# Patient Record
Sex: Female | Born: 1975 | Race: White | Hispanic: No | Marital: Married | Smoking: Never smoker
Health system: Southern US, Community
[De-identification: ages and names within clinical notes are randomized; demographics above are authoritative.]

## PROBLEM LIST (undated history)

## (undated) DIAGNOSIS — E282 Polycystic ovarian syndrome: Secondary | ICD-10-CM

## (undated) DIAGNOSIS — L089 Local infection of the skin and subcutaneous tissue, unspecified: Secondary | ICD-10-CM

## (undated) HISTORY — PX: WISDOM TOOTH EXTRACTION: SHX21

---

## 2000-04-20 HISTORY — PX: COLONOSCOPY: SHX174

## 2001-01-26 ENCOUNTER — Ambulatory Visit (HOSPITAL_COMMUNITY): Admission: RE | Admit: 2001-01-26 | Discharge: 2001-01-26 | Payer: Self-pay | Admitting: *Deleted

## 2001-01-26 ENCOUNTER — Encounter (INDEPENDENT_AMBULATORY_CARE_PROVIDER_SITE_OTHER): Payer: Self-pay | Admitting: *Deleted

## 2002-01-16 ENCOUNTER — Other Ambulatory Visit: Admission: RE | Admit: 2002-01-16 | Discharge: 2002-01-16 | Payer: Self-pay | Admitting: Obstetrics & Gynecology

## 2003-02-13 ENCOUNTER — Other Ambulatory Visit: Admission: RE | Admit: 2003-02-13 | Discharge: 2003-02-13 | Payer: Self-pay | Admitting: Obstetrics and Gynecology

## 2004-07-23 ENCOUNTER — Ambulatory Visit: Payer: Self-pay | Admitting: Internal Medicine

## 2004-07-30 ENCOUNTER — Ambulatory Visit: Payer: Self-pay | Admitting: Internal Medicine

## 2004-08-13 ENCOUNTER — Ambulatory Visit: Payer: Self-pay | Admitting: Internal Medicine

## 2008-05-07 ENCOUNTER — Encounter: Admission: RE | Admit: 2008-05-07 | Discharge: 2008-05-07 | Payer: Self-pay | Admitting: Family Medicine

## 2009-05-16 ENCOUNTER — Inpatient Hospital Stay (HOSPITAL_COMMUNITY): Admission: AD | Admit: 2009-05-16 | Discharge: 2009-05-19 | Payer: Self-pay | Admitting: Obstetrics

## 2009-05-21 ENCOUNTER — Encounter: Admission: RE | Admit: 2009-05-21 | Discharge: 2009-06-14 | Payer: Self-pay | Admitting: Obstetrics

## 2009-05-23 ENCOUNTER — Ambulatory Visit: Admission: RE | Admit: 2009-05-23 | Discharge: 2009-05-23 | Payer: Self-pay | Admitting: Obstetrics

## 2010-07-06 LAB — CBC
HCT: 31.8 % — ABNORMAL LOW (ref 36.0–46.0)
Hemoglobin: 10.7 g/dL — ABNORMAL LOW (ref 12.0–15.0)
Platelets: 225 10*3/uL (ref 150–400)
RBC: 3.69 MIL/uL — ABNORMAL LOW (ref 3.87–5.11)
RBC: 4.72 MIL/uL (ref 3.87–5.11)
WBC: 19.4 10*3/uL — ABNORMAL HIGH (ref 4.0–10.5)

## 2010-07-06 LAB — RPR: RPR Ser Ql: NONREACTIVE

## 2012-05-25 LAB — OB RESULTS CONSOLE HEPATITIS B SURFACE ANTIGEN: Hepatitis B Surface Ag: NEGATIVE

## 2012-05-25 LAB — OB RESULTS CONSOLE RPR: RPR: NONREACTIVE

## 2012-11-01 ENCOUNTER — Other Ambulatory Visit: Payer: Self-pay | Admitting: Obstetrics

## 2012-11-07 ENCOUNTER — Encounter (HOSPITAL_COMMUNITY): Payer: Self-pay | Admitting: Anesthesiology

## 2012-11-07 ENCOUNTER — Inpatient Hospital Stay (HOSPITAL_COMMUNITY): Payer: Managed Care, Other (non HMO) | Admitting: Anesthesiology

## 2012-11-07 ENCOUNTER — Encounter (HOSPITAL_COMMUNITY)
Admission: AD | Disposition: A | Discharge status: Home / Self Care | Payer: Self-pay | Source: Ambulatory Visit | Attending: Obstetrics & Gynecology

## 2012-11-07 ENCOUNTER — Inpatient Hospital Stay (HOSPITAL_COMMUNITY)
Admission: AD | Admit: 2012-11-07 | Discharge: 2012-11-10 | DRG: 765 | Disposition: A | Discharge status: Home / Self Care | Payer: Managed Care, Other (non HMO) | Source: Ambulatory Visit | Attending: Obstetrics & Gynecology | Admitting: Obstetrics & Gynecology

## 2012-11-07 ENCOUNTER — Encounter (HOSPITAL_COMMUNITY): Payer: Self-pay | Admitting: *Deleted

## 2012-11-07 DIAGNOSIS — O30049 Twin pregnancy, dichorionic/diamniotic, unspecified trimester: Secondary | ICD-10-CM

## 2012-11-07 DIAGNOSIS — Z302 Encounter for sterilization: Secondary | ICD-10-CM

## 2012-11-07 DIAGNOSIS — O9903 Anemia complicating the puerperium: Secondary | ICD-10-CM | POA: Diagnosis not present

## 2012-11-07 DIAGNOSIS — O30009 Twin pregnancy, unspecified number of placenta and unspecified number of amniotic sacs, unspecified trimester: Secondary | ICD-10-CM | POA: Diagnosis present

## 2012-11-07 DIAGNOSIS — O34219 Maternal care for unspecified type scar from previous cesarean delivery: Principal | ICD-10-CM | POA: Diagnosis present

## 2012-11-07 DIAGNOSIS — O4290 Premature rupture of membranes, unspecified as to length of time between rupture and onset of labor, unspecified weeks of gestation: Secondary | ICD-10-CM | POA: Diagnosis present

## 2012-11-07 DIAGNOSIS — D62 Acute posthemorrhagic anemia: Secondary | ICD-10-CM | POA: Diagnosis not present

## 2012-11-07 HISTORY — DX: Polycystic ovarian syndrome: E28.2

## 2012-11-07 LAB — CBC
HCT: 37 % (ref 36.0–46.0)
Hemoglobin: 12.8 g/dL (ref 12.0–15.0)
MCH: 29.2 pg (ref 26.0–34.0)
MCV: 84.3 fL (ref 78.0–100.0)
RBC: 4.39 MIL/uL (ref 3.87–5.11)
WBC: 14.3 10*3/uL — ABNORMAL HIGH (ref 4.0–10.5)

## 2012-11-07 LAB — TYPE AND SCREEN: Antibody Screen: NEGATIVE

## 2012-11-07 SURGERY — Surgical Case
Anesthesia: Spinal | Site: Abdomen | Laterality: Bilateral | Wound class: Clean Contaminated

## 2012-11-07 MED ORDER — NALOXONE HCL 1 MG/ML IJ SOLN
1.0000 ug/kg/h | INTRAVENOUS | Status: DC | PRN
  Filled 2012-11-07: qty 2

## 2012-11-07 MED ORDER — SIMETHICONE 80 MG PO CHEW
80.0000 mg | CHEWABLE_TABLET | Freq: Three times a day (TID) | ORAL | Status: DC
  Administered 2012-11-07 – 2012-11-10 (×9): 80 mg via ORAL

## 2012-11-07 MED ORDER — KETOROLAC TROMETHAMINE 30 MG/ML IJ SOLN
30.0000 mg | Freq: Four times a day (QID) | INTRAMUSCULAR | Status: AC | PRN
  Administered 2012-11-07: 30 mg via INTRAVENOUS

## 2012-11-07 MED ORDER — LACTATED RINGERS IV SOLN
INTRAVENOUS | Status: DC
  Administered 2012-11-07 (×3): via INTRAVENOUS

## 2012-11-07 MED ORDER — IBUPROFEN 600 MG PO TABS
600.0000 mg | ORAL_TABLET | Freq: Four times a day (QID) | ORAL | Status: DC
Start: 2012-11-07 — End: 2012-11-10
  Administered 2012-11-07 – 2012-11-10 (×12): 600 mg via ORAL
  Filled 2012-11-07 (×12): qty 1

## 2012-11-07 MED ORDER — OXYTOCIN 40 UNITS IN LACTATED RINGERS INFUSION - SIMPLE MED: 62.5000 mL/h | INTRAVENOUS | Status: AC

## 2012-11-07 MED ORDER — PRENATAL MULTIVITAMIN CH
1.0000 | ORAL_TABLET | Freq: Every day | ORAL | Status: DC
  Administered 2012-11-07 – 2012-11-09 (×3): 1 via ORAL
  Filled 2012-11-07 (×3): qty 1

## 2012-11-07 MED ORDER — LACTATED RINGERS IV SOLN
INTRAVENOUS | Status: DC | PRN
  Administered 2012-11-07: 04:00:00 via INTRAVENOUS

## 2012-11-07 MED ORDER — ONDANSETRON HCL 4 MG PO TABS: 4.0000 mg | ORAL_TABLET | ORAL | Status: DC | PRN

## 2012-11-07 MED ORDER — MENTHOL 3 MG MT LOZG: 1.0000 | LOZENGE | OROMUCOSAL | Status: DC | PRN

## 2012-11-07 MED ORDER — METOCLOPRAMIDE HCL 5 MG/ML IJ SOLN
10.0000 mg | Freq: Once | INTRAMUSCULAR | Status: AC
  Administered 2012-11-07: 10 mg via INTRAVENOUS
  Filled 2012-11-07: qty 2

## 2012-11-07 MED ORDER — DIBUCAINE 1 % RE OINT: 1.0000 "application " | TOPICAL_OINTMENT | RECTAL | Status: DC | PRN

## 2012-11-07 MED ORDER — NALOXONE HCL 0.4 MG/ML IJ SOLN: 0.4000 mg | INTRAMUSCULAR | Status: DC | PRN

## 2012-11-07 MED ORDER — SIMETHICONE 80 MG PO CHEW
80.0000 mg | CHEWABLE_TABLET | ORAL | Status: DC | PRN
  Administered 2012-11-08 – 2012-11-09 (×2): 80 mg via ORAL

## 2012-11-07 MED ORDER — KETOROLAC TROMETHAMINE 30 MG/ML IJ SOLN: 30.0000 mg | Freq: Four times a day (QID) | INTRAMUSCULAR | Status: AC | PRN

## 2012-11-07 MED ORDER — SCOPOLAMINE 1 MG/3DAYS TD PT72
1.0000 | MEDICATED_PATCH | Freq: Once | TRANSDERMAL | Status: AC
  Administered 2012-11-07: 1.5 mg via TRANSDERMAL

## 2012-11-07 MED ORDER — SENNOSIDES-DOCUSATE SODIUM 8.6-50 MG PO TABS
2.0000 | ORAL_TABLET | Freq: Every day | ORAL | Status: DC
  Administered 2012-11-07 – 2012-11-10 (×3): 2 via ORAL

## 2012-11-07 MED ORDER — CITRIC ACID-SODIUM CITRATE 334-500 MG/5ML PO SOLN
30.0000 mL | Freq: Once | ORAL | Status: AC
  Administered 2012-11-07: 30 mL via ORAL
  Filled 2012-11-07: qty 15

## 2012-11-07 MED ORDER — DIPHENHYDRAMINE HCL 25 MG PO CAPS: 25.0000 mg | ORAL_CAPSULE | Freq: Four times a day (QID) | ORAL | Status: DC | PRN

## 2012-11-07 MED ORDER — ONDANSETRON HCL 4 MG/2ML IJ SOLN: 4.0000 mg | Freq: Three times a day (TID) | INTRAMUSCULAR | Status: DC | PRN

## 2012-11-07 MED ORDER — FENTANYL CITRATE 0.05 MG/ML IJ SOLN
INTRAMUSCULAR | Status: DC | PRN
  Administered 2012-11-07: 85 ug via INTRAVENOUS

## 2012-11-07 MED ORDER — DIPHENHYDRAMINE HCL 25 MG PO CAPS: 25.0000 mg | ORAL_CAPSULE | ORAL | Status: DC | PRN

## 2012-11-07 MED ORDER — ONDANSETRON HCL 4 MG/2ML IJ SOLN: 4.0000 mg | INTRAMUSCULAR | Status: DC | PRN

## 2012-11-07 MED ORDER — ONDANSETRON HCL 4 MG/2ML IJ SOLN
INTRAMUSCULAR | Status: DC | PRN
  Administered 2012-11-07: 4 mg via INTRAVENOUS

## 2012-11-07 MED ORDER — FENTANYL CITRATE 0.05 MG/ML IJ SOLN
INTRAMUSCULAR | Status: DC | PRN
  Administered 2012-11-07: 15 ug via INTRATHECAL

## 2012-11-07 MED ORDER — OXYTOCIN 10 UNIT/ML IJ SOLN
40.0000 [IU] | INTRAVENOUS | Status: DC | PRN
  Administered 2012-11-07: 40 [IU] via INTRAVENOUS

## 2012-11-07 MED ORDER — SCOPOLAMINE 1 MG/3DAYS TD PT72
MEDICATED_PATCH | TRANSDERMAL | Status: AC
  Filled 2012-11-07: qty 1

## 2012-11-07 MED ORDER — TETANUS-DIPHTH-ACELL PERTUSSIS 5-2.5-18.5 LF-MCG/0.5 IM SUSP: 0.5000 mL | Freq: Once | INTRAMUSCULAR | Status: DC

## 2012-11-07 MED ORDER — MEPERIDINE HCL 25 MG/ML IJ SOLN
6.2500 mg | INTRAMUSCULAR | Status: DC | PRN
  Administered 2012-11-07: 6.25 mg via INTRAVENOUS

## 2012-11-07 MED ORDER — LANOLIN HYDROUS EX OINT: 1.0000 "application " | TOPICAL_OINTMENT | CUTANEOUS | Status: DC | PRN

## 2012-11-07 MED ORDER — NALBUPHINE HCL 10 MG/ML IJ SOLN
5.0000 mg | INTRAMUSCULAR | Status: DC | PRN
  Filled 2012-11-07: qty 1

## 2012-11-07 MED ORDER — DIPHENHYDRAMINE HCL 50 MG/ML IJ SOLN: 25.0000 mg | INTRAMUSCULAR | Status: DC | PRN

## 2012-11-07 MED ORDER — BUPIVACAINE IN DEXTROSE 0.75-8.25 % IT SOLN
INTRATHECAL | Status: DC | PRN
  Administered 2012-11-07: 11.75 mg via INTRATHECAL

## 2012-11-07 MED ORDER — CEFAZOLIN SODIUM-DEXTROSE 2-3 GM-% IV SOLR
2.0000 g | Freq: Once | INTRAVENOUS | Status: AC
  Administered 2012-11-07: 2 g via INTRAVENOUS

## 2012-11-07 MED ORDER — ZOLPIDEM TARTRATE 5 MG PO TABS: 5.0000 mg | ORAL_TABLET | Freq: Every evening | ORAL | Status: DC | PRN

## 2012-11-07 MED ORDER — OXYCODONE-ACETAMINOPHEN 5-325 MG PO TABS
1.0000 | ORAL_TABLET | ORAL | Status: DC | PRN
  Administered 2012-11-08 (×4): 1 via ORAL
  Filled 2012-11-07 (×4): qty 1

## 2012-11-07 MED ORDER — LACTATED RINGERS IV SOLN
INTRAVENOUS | Status: DC
  Administered 2012-11-07: 14:00:00 via INTRAVENOUS

## 2012-11-07 MED ORDER — MEPERIDINE HCL 25 MG/ML IJ SOLN
INTRAMUSCULAR | Status: DC | PRN
  Administered 2012-11-07 (×2): 12.5 mg via INTRAVENOUS

## 2012-11-07 MED ORDER — METOCLOPRAMIDE HCL 5 MG/ML IJ SOLN: 10.0000 mg | Freq: Three times a day (TID) | INTRAMUSCULAR | Status: DC | PRN

## 2012-11-07 MED ORDER — FAMOTIDINE IN NACL 20-0.9 MG/50ML-% IV SOLN
20.0000 mg | Freq: Once | INTRAVENOUS | Status: AC
  Administered 2012-11-07: 20 mg via INTRAVENOUS
  Filled 2012-11-07: qty 50

## 2012-11-07 MED ORDER — WITCH HAZEL-GLYCERIN EX PADS: 1.0000 "application " | MEDICATED_PAD | CUTANEOUS | Status: DC | PRN

## 2012-11-07 MED ORDER — KETOROLAC TROMETHAMINE 30 MG/ML IJ SOLN
INTRAMUSCULAR | Status: AC
  Filled 2012-11-07: qty 1

## 2012-11-07 MED ORDER — MORPHINE SULFATE (PF) 0.5 MG/ML IJ SOLN
INTRAMUSCULAR | Status: DC | PRN
  Administered 2012-11-07: .1 mg via INTRATHECAL

## 2012-11-07 MED ORDER — MEPERIDINE HCL 25 MG/ML IJ SOLN
INTRAMUSCULAR | Status: AC
  Filled 2012-11-07: qty 1

## 2012-11-07 MED ORDER — DIPHENHYDRAMINE HCL 50 MG/ML IJ SOLN: 12.5000 mg | INTRAMUSCULAR | Status: DC | PRN

## 2012-11-07 MED ORDER — PHENYLEPHRINE HCL 10 MG/ML IJ SOLN
INTRAMUSCULAR | Status: DC | PRN
  Administered 2012-11-07 (×2): 40 ug via INTRAVENOUS

## 2012-11-07 MED ORDER — SODIUM CHLORIDE 0.9 % IJ SOLN: 3.0000 mL | INTRAMUSCULAR | Status: DC | PRN

## 2012-11-07 SURGICAL SUPPLY — 39 items
APL SKNCLS STERI-STRIP NONHPOA (GAUZE/BANDAGES/DRESSINGS) ×2
BENZOIN TINCTURE PRP APPL 2/3 (GAUZE/BANDAGES/DRESSINGS) ×3 IMPLANT
CLAMP CORD UMBIL (MISCELLANEOUS) ×4 IMPLANT
CLOTH BEACON ORANGE TIMEOUT ST (SAFETY) ×3 IMPLANT
CONTAINER PREFILL 10% NBF 15ML (MISCELLANEOUS) IMPLANT
DRAPE LG THREE QUARTER DISP (DRAPES) ×5 IMPLANT
DRSG OPSITE POSTOP 4X10 (GAUZE/BANDAGES/DRESSINGS) ×3 IMPLANT
DURAPREP 26ML APPLICATOR (WOUND CARE) ×3 IMPLANT
ELECT REM PT RETURN 9FT ADLT (ELECTROSURGICAL) ×3
ELECTRODE REM PT RTRN 9FT ADLT (ELECTROSURGICAL) ×2 IMPLANT
EXTRACTOR VACUUM KIWI (MISCELLANEOUS) IMPLANT
EXTRACTOR VACUUM M CUP 4 TUBE (SUCTIONS) IMPLANT
GLOVE BIO SURGEON STRL SZ7 (GLOVE) ×3 IMPLANT
GLOVE BIOGEL PI IND STRL 7.0 (GLOVE) ×2 IMPLANT
GLOVE BIOGEL PI INDICATOR 7.0 (GLOVE) ×1
GOWN STRL REIN XL XLG (GOWN DISPOSABLE) ×6 IMPLANT
KIT ABG SYR 3ML LUER SLIP (SYRINGE) ×4 IMPLANT
NDL HYPO 25X5/8 SAFETYGLIDE (NEEDLE) IMPLANT
NEEDLE HYPO 25X5/8 SAFETYGLIDE (NEEDLE) ×6 IMPLANT
NS IRRIG 1000ML POUR BTL (IV SOLUTION) ×3 IMPLANT
PACK C SECTION WH (CUSTOM PROCEDURE TRAY) ×3 IMPLANT
PAD OB MATERNITY 4.3X12.25 (PERSONAL CARE ITEMS) ×3 IMPLANT
RTRCTR C-SECT PINK 25CM LRG (MISCELLANEOUS) ×2 IMPLANT
STAPLER VISISTAT 35W (STAPLE) IMPLANT
STRIP CLOSURE SKIN 1/4X4 (GAUZE/BANDAGES/DRESSINGS) ×2 IMPLANT
SUT PLAIN 0 NONE (SUTURE) ×2 IMPLANT
SUT PLAIN 2 0 (SUTURE) ×3
SUT PLAIN ABS 2-0 CT1 27XMFL (SUTURE) ×1 IMPLANT
SUT VIC AB 0 CT1 27 (SUTURE) ×6
SUT VIC AB 0 CT1 27XBRD ANBCTR (SUTURE) ×4 IMPLANT
SUT VIC AB 0 CTX 36 (SUTURE) ×9
SUT VIC AB 0 CTX36XBRD ANBCTRL (SUTURE) ×6 IMPLANT
SUT VIC AB 2-0 CT1 27 (SUTURE) ×6
SUT VIC AB 2-0 CT1 TAPERPNT 27 (SUTURE) ×4 IMPLANT
SUT VIC AB 4-0 KS 27 (SUTURE) ×2 IMPLANT
SUT VICRYL 0 TIES 12 18 (SUTURE) IMPLANT
TOWEL OR 17X24 6PK STRL BLUE (TOWEL DISPOSABLE) ×9 IMPLANT
TRAY FOLEY CATH 14FR (SET/KITS/TRAYS/PACK) ×2 IMPLANT
WATER STERILE IRR 1000ML POUR (IV SOLUTION) ×3 IMPLANT

## 2012-11-07 NOTE — MAU Note (Signed)
Dr. Cassidy at the bedside 

## 2012-11-07 NOTE — Progress Notes (Signed)
Patient ID: Leah Cook, female   DOB: April 09, 1976, 37 y.o.   MRN: [id number] INTERVAL NOTE:  S:   Sitting in bed, BFing, min cramping, foley in place with clear urine, small bleed, denies HA/NV/dizziness  O:   VSS, AAO x 3, NAD  Tegaderm and Honeycomb dressing in place  Lochia scant  SCD hose in place  A / P:   PPD #0, S/P Repeat Cesarean delivery, PROM, Di-Di Twins  Stable post partum  Routine PP orders  Kenard Gower, MSN, CNM 11/07/2012, 8:54 AM

## 2012-11-07 NOTE — Anesthesia Preprocedure Evaluation (Signed)
Anesthesia Evaluation  Patient identified by MRN, date of birth, ID band Patient awake    Reviewed: Allergy & Precautions, H&P , NPO status , Patient's Chart, lab work & pertinent test results, reviewed documented beta blocker date and time   History of Anesthesia Complications Negative for: history of anesthetic complications  Airway Mallampati: I TM Distance: >3 FB Neck ROM: full    Dental  (+) Teeth Intact   Pulmonary neg pulmonary ROS,  breath sounds clear to auscultation        Cardiovascular negative cardio ROS  Rhythm:regular Rate:Normal     Neuro/Psych negative neurological ROS  negative psych ROS   GI/Hepatic negative GI ROS, Neg liver ROS,   Endo/Other  negative endocrine ROS  Renal/GU negative Renal ROS  negative genitourinary   Musculoskeletal   Abdominal   Peds  Hematology plt 123   Anesthesia Other Findings Last ate crackers at 9:30 pm, pizza at 7:30 pm  Reproductive/Obstetrics (+) Pregnancy (h/o c/s x1, now with twins and SROM for repeat C/S and BTL)                           Anesthesia Physical Anesthesia Plan  ASA: II and emergent  Anesthesia Plan: Spinal   Post-op Pain Management:    Induction:   Airway Management Planned:   Additional Equipment:   Intra-op Plan:   Post-operative Plan:   Informed Consent: I have reviewed the patients History and Physical, chart, labs and discussed the procedure including the risks, benefits and alternatives for the proposed anesthesia with the patient or authorized representative who has indicated his/her understanding and acceptance.     Plan Discussed with: Surgeon and CRNA  Anesthesia Plan Comments:         Anesthesia Quick Evaluation

## 2012-11-07 NOTE — Anesthesia Postprocedure Evaluation (Signed)
Anesthesia Post Note  Patient: Leah Cook  Procedure(s) Performed: Procedure(s): Repeat CESAREAN SECTION of  twin "A"  baby girl   at  65  APGAR 9/9 and twin "B" baby boy  at 42   APGAR  9/9 WITH BILATERAL TUBAL LIGATION (Bilateral)  Anesthesia type: Spinal  Patient location: Mother Baby  Post pain: Pain level controlled  Post assessment: Post-op Vital signs reviewed  Last Vitals: BP 115/69  Pulse 85  Temp(Src) 36.8 C (Oral)  Resp 20  Ht 5\' 5"  (1.651 m)  Wt 187 lb (84.823 kg)  BMI 31.12 kg/m2  SpO2 97%  Post vital signs: Reviewed  Level of consciousness: awake  Complications: No apparent anesthesia complications

## 2012-11-07 NOTE — Anesthesia Procedure Notes (Signed)
Spinal  Patient location during procedure: OR Start time: 11/07/2012 3:55 AM Staffing Performed by: anesthesiologist  Preanesthetic Checklist Completed: patient identified, site marked, surgical consent, pre-op evaluation, timeout performed, IV checked, risks and benefits discussed and monitors and equipment checked Spinal Block Patient position: sitting Prep: site prepped and draped and DuraPrep Patient monitoring: heart rate, cardiac monitor, continuous pulse ox and blood pressure Approach: midline Location: L3-4 Injection technique: single-shot Needle Needle type: Pencan  Needle gauge: 24 G Needle length: 9 cm Assessment Sensory level: T4 Additional Notes Clear free flow CSF on first attempt.  No paresthesia.  Patient tolerated procedure well with no apparent complications.  Jasmine December, MD

## 2012-11-07 NOTE — Consult Note (Signed)
Neonatology Note:   Attendance at C-section:    I was asked by Dr. Juliene Pina to attend this repeat C/S at 35 5/7 weeks due to twin gestation, SROM and onset of labor, and previous C/section. The mother is a G3P1A1 O pos, GBS not done (but urine culture of 09/14/12 was positive for GBS) with Di/Di twins. ROM 3 hours prior to delivery, fluid clear.  Twin A, a female, was delivered vertex and was vigorous with good spontaneous cry and tone. Needed only minimal bulb suctioning. Ap 9/9. Lungs clear to ausc in DR. To CN to care of Pediatrician.  Twin B, a female, was delivered breech and was also vigorous with good spontaneous cry and tone. Needed only minimal bulb suctioning. Ap 9/9. Lungs clear to ausc in DR. To CN to care of Pediatrician.    Doretha Sou, MD

## 2012-11-07 NOTE — Transfer of Care (Signed)
Immediate Anesthesia Transfer of Care Note  Patient: Leah Cook  Procedure(s) Performed: Procedure(s): Repeat CESAREAN SECTION of  twin "A"  baby girl   at  27  APGAR 9/9 and twin "B" baby boy  at 13   APGAR  9/9 WITH BILATERAL TUBAL LIGATION (Bilateral)  Patient Location: PACU  Anesthesia Type:Spinal  Level of Consciousness: awake  Airway & Oxygen Therapy: Patient Spontanous Breathing  Post-op Assessment: Report given to PACU RN and Post -op Vital signs reviewed and stable  Post vital signs: stable  Complications: No apparent anesthesia complications

## 2012-11-07 NOTE — OR Nursing (Signed)
76 ml blood loss during fundal massage by DLWegner RN, cord blood x 2 twin A and cord blood x2 twin B

## 2012-11-07 NOTE — H&P (Signed)
Leah Cook is a 37 y.o. female presenting 35 wks with Di-Di twin after Femara pregnancy.  Here with ROM at 1 am. PCOS hx, Metformin in 1st trim, passed 3 hr GTT.  PNCare Dr Ernestina Penna, [facility]. Twins- Di-Di - Female/female, Pt desires permanent sterilization ObHx- prior c/s x 1 for failure to descent Maternal Medical History:  Reason for admission: Rupture of membranes.     OB History   Grav Para Term Preterm Abortions TAB SAB Ect Mult Living   3 1 1  1  1   1      History reviewed. No pertinent past medical history. Past Surgical History  Procedure Laterality Date  . Cesarean section    . Wisdom tooth extraction     Family History: family history includes Cancer in her maternal grandfather and maternal grandmother; Diabetes in her father; Heart disease in her father; Hypertension in her paternal grandmother; and Miscarriages / Stillbirths in her paternal grandmother. Social History:  reports that she has never smoked. She has never used smokeless tobacco. She reports that she does not drink alcohol or use illicit drugs.   Prenatal Transfer Tool  Maternal Diabetes: No Genetic Screening: Normal Ultrascreen neg, AFP1 nl Maternal Ultrasounds/Referrals: Normal, twin B marginal cord insert, good interval growth Fetal Ultrasounds or other Referrals:  None Maternal Substance Abuse:  No Significant Maternal Medications:  Meds include: Other: Metformin in 1st trim Significant Maternal Lab Results:  None Other Comments:  Femara pregnancy   ROS  Neg      Blood pressure 135/81, pulse 116, temperature 98.9 F (37.2 C), temperature source Oral, height 5\' 5"  (1.651 m), weight 187 lb (84.823 kg), SpO2 99.00%. Exam Physical Exam  A&O x 3, no acute distress. Pleasant HEENT neg, no thyromegaly Lungs CTA bilat CV RRR, A1S2 normal Abdo soft, non tender, non acute Extr no edema/ tenderness Pelvic deferred FHT  140s/ 140s both category I  Toco present q 3 min  Prenatal labs: ABO,  Rh:  O(+) Antibody:  neg Rubella:  Immune RPR:   Neg x 2 HBsAg:   Neg HIV:   Neg GBS:   Not done  3hr GTT nl   Assessment/Plan: 37 yo, G3P1011 at 35.5 wks, well dated since Femara pregnancy. Di-Di twins, Female/ female Wants repeat C/s and permanent sterilization with tubal ligation Risks/complications of surgery reviewed incl infection, bleeding, damage to internal organs including bladder, bowels, ureters, blood vessels, other risks from anesthesia, VTE and delayed complications of any surgery, complications in future surgery reviewed. Also discussed neonatal complications incl difficult delivery, laceration, vacuum assistance, TTN etc. Pt understands and agrees, all concerns addressed.   Also reviewed risk of failure and ectopic pregnancy with TL. Understands and agrees.     Terrel Manalo R 11/07/2012, 3:43 AM

## 2012-11-07 NOTE — MAU Note (Signed)
Patient states water broke at 1am. Clear fluid with a tinge of blood. A few contractions with minimal fetal movement since rupture of membranes.

## 2012-11-07 NOTE — Op Note (Signed)
Cesarean Section Procedure Note  Leah Cook  11/07/2012  Indications: 35.5 wks, Di-Di twins with ruptured membranes and labor , prior cesarean section. Elective permanent sterilization desired  Procedure: Repeat Low Transverse C-section and Bilateral Partial Salpingectomy  Pre-operative Diagnosis: previous cesarean section, in labor, ruptured membranes, Di-Di Twins                                               Permanent sterilization desired  Post-operative Diagnosis: Same   Surgeon:  Robley Fries, MD - Primary   Assistants: none  Anesthesia: spinal   Procedure Details:  The patient was seen in the Maternity Admisson Room. She was noted to be in early labor after membranes ruptured at 1 am. She desired Repeat cesarean section. The risks, benefits, complications, treatment options, and expected outcomes were discussed with the patient. The patient concurred with the proposed plan, giving informed consent. identified as Leah Cook and the procedure verified as Repeat C-Section Delivery and bilateral tubal ligation. A Time Out was held and the above information confirmed. 2 gm Ancef started. After induction of Spinal anesthesia, the patient was draped and prepped in the usual sterile manner. A Pfannenstiel incision was made and carried down through the subcutaneous tissue to the fascia. Fascial incision was made and extended transversely. The fascia was separated from the underlying rectus tissue superiorly and inferiorly. The peritoneum was identified and entered. Peritoneal incision was extended longitudinally. No adhesions noted. Alexis-O retractor was placed securely.  The utero-vesical peritoneal reflection was incised transversely. Lower segment was thin.  A low transverse uterine incision was made. Twin A- GIRL delivered Cephalic at 4.20 am, clear fluid noted. Loose nuchal cord reduced and cord clamped and cut, baby handed off to NICU team, vigorous cry with Apgars 9 and 9. Twin  B -BOY presented as Footling Breech. Amniotomy noted clear fluid. Baby BOY delivered at 4.21 am by complete breech extraction. Cord clamped and cut and baby handed to NICU team, Apgars 9 and 9. Cord ph was not sent. Umbilical cord blood was obtained for evaluation for both. Babies stable and stayed in [place].  Both the placentas were removed together,  Intact and appeared normal. The uterine outline, tubes and ovaries appeared normal. The uterine incision was closed with running locked sutures of 0Vicryl followed by a second imbricating layer. Hemostasis was observed. Bilateral partial salpingectomy with fimbriectomies done using 2-0 Plain gut with 2 ties each with excellent hemostasis. Alexis-O retractor removed. Peritoneum closed with 2-0 Vicryl and muscles approximated in lower end. The fascia was then reapproximated with running sutures of 0Vicryl. The subcutaneous closure was performed using 2-0 plain gut. The skin was closed with 4-0Vicryl. Sterile dressing placed.   Instrument, sponge, and needle counts were correct prior the abdominal closure and were correct at the conclusion of the case.   Findings:  Twin A GIRL, born cephalic at 4.20 am and Twin B BOY, born breech at 4.21 am. Both with Apgars 9 and 9 at 1, 5 minutes. Clear amniotic fluid. Normal placenta and 3 vessel cord for both.  Both tubes and ovaries normal. Bilateral partial salpingectomy including fimbriectomies done for permament sterilization,    Estimated Blood Loss: 800 cc  Total IV Fluids:  3400 cc LR  Urine Output: 200 cc clear urine in foley  Specimens: Partial salpingectomy include both fimbria of both fallopian tubes. Cord  blood of both babies.   Complications: no complications  Disposition: PACU - hemodynamically stable.   Maternal Condition: stable   Baby condition / location:  nursery-stable  Attending Attestation: I performed the procedure.   Signed: Surgeon(s): Robley Fries, MD

## 2012-11-07 NOTE — Anesthesia Postprocedure Evaluation (Signed)
  Anesthesia Post-op Note  Anesthesia Post Note  Patient: Leah Cook  Procedure(s) Performed: Procedure(s) (LRB): Repeat CESAREAN SECTION of  twin "A"  baby girl   at  41  APGAR 9/9 and twin "B" baby boy  at 40   APGAR  9/9 WITH BILATERAL TUBAL LIGATION (Bilateral)  Anesthesia type: Spinal  Patient location: PACU  Post pain: Pain level controlled  Post assessment: Post-op Vital signs reviewed  Last Vitals:  Filed Vitals:   11/07/12 0647  BP: 113/71  Pulse: 67  Temp: 36.6 C  Resp: 16    Post vital signs: Reviewed  Level of consciousness: awake  Complications: No apparent anesthesia complications

## 2012-11-08 ENCOUNTER — Encounter (HOSPITAL_COMMUNITY): Payer: Self-pay | Admitting: Obstetrics & Gynecology

## 2012-11-08 LAB — CBC
HCT: 32.5 % — ABNORMAL LOW (ref 36.0–46.0)
Hemoglobin: 10.7 g/dL — ABNORMAL LOW (ref 12.0–15.0)
MCH: 28.3 pg (ref 26.0–34.0)
RBC: 3.78 MIL/uL — ABNORMAL LOW (ref 3.87–5.11)

## 2012-11-08 MED ORDER — CEFAZOLIN SODIUM-DEXTROSE 2-3 GM-% IV SOLR: 2.0000 g | INTRAVENOUS | Status: DC

## 2012-11-08 NOTE — Progress Notes (Signed)
POD # 1  Subjective: Pt reports feeling good/ Pain controlled with Ibuprofen and Percocet Tolerating po/ Voiding without problems/ No n/v/Flatus burping Activity: ad lib Bleeding is light Newborn info:  Information for the patient's newborn:  Ceclia, [place] Girl [place]  female Information for the patient's newborn:  Jaila, Schellhorn [[id number]]  female Circumcision: planning/ Feeding: breast and bottle   Objective: VS: Blood pressure 107/70, pulse 76, temperature 98.6 F (37 C), temperature source Oral, resp. rate 18, height 5\' 5"  (1.651 m), weight 84.823 kg (187 lb), SpO2 98.00%, unknown if currently breastfeeding.    Intake/Output Summary (Last 24 hours) at 11/08/12 0834 Last data filed at 11/08/12 0120  Gross per 24 hour  Intake   3043 ml  Output   3775 ml  Net   -732 ml      Recent Labs  11/07/12 0311 11/08/12 0555  WBC 14.3* 13.0*  HGB 12.8 10.7*  HCT 37.0 32.5*  PLT 123* 106*    Blood type: --/--/O POS, O POS (07/21 9147) Rubella: Immune (02/05 0000)    Physical Exam:  General: alert and cooperative CV: Regular rate and rhythm Resp: clear Abdomen: soft, nontender, normal bowel sounds Incision: healing well, no erythema, no swelling, honeycomb dressing-nickel size dark red drainage Uterine Fundus: firm, below umbilicus, nontender Lochia: minimal Ext: trace to bilateral lower extremities, and Homans sign is negative, no sign of DVT     Assessment: POD # 1/ G3P1113 S/P C/Section and BTL d/t SROM, twins, undesired fertility Acute blood loss anemia Doing well  Plan: Ambulate Continue routine post op orders   Signed: Lawernce Pitts, MSN, CNM 11/08/2012, 8:34 AM

## 2012-11-09 MED ORDER — OXYCODONE-ACETAMINOPHEN 5-325 MG PO TABS: 1.0000 | ORAL_TABLET | ORAL | Status: DC | PRN

## 2012-11-09 MED ORDER — IBUPROFEN 600 MG PO TABS: 600.0000 mg | ORAL_TABLET | Freq: Four times a day (QID) | ORAL | Status: DC

## 2012-11-09 NOTE — Discharge Summary (Signed)
Reviewed and agree with note and plan. V.Cherokee Clowers, MD  

## 2012-11-09 NOTE — Discharge Summary (Signed)
POSTOPERATIVE DISCHARGE SUMMARY:  Patient ID: Leah Cook MRN: [id number] DOB/AGE: 35/02/1976 35 y.o.  Admit date: 11/07/2012 Admission Diagnoses: twins at 35.5 weeks / SROM / planned repeat cesarean section / undesired fertility  Discharge date:  11/09/2012 Discharge Diagnoses: POD 2 s/p repeat CS and BTL / mild ABL anemia - stable  Prenatal history: [id number]   EDC : 12/07/2012, by Other Basis  Pren[facility] care at [facility]  Primary provider : Mody Prenatal course complicated by PCOS / twins : di-di / previous CS  Prenatal Labs: ABO, Rh: --/--/O POS, O POS (07/21 1478) Antibody: NEG (07/21 0311) Rubella: Immune (02/05 0000)   RPR: NON REACTIVE (07/21 0311)  HBsAg: Negative (02/05 0000)  HIV: Non-reactive (02/05 0000)  GTT : NL GBS:   not done  Medical / Surgical History :  Past medical history:  Past Medical History  Diagnosis Date  . PCOS (polycystic ovarian syndrome)   . Postpartum care following cesarean delivery (7/21) 11/07/2012    Past surgical history:  Past Surgical History  Procedure Laterality Date  . Cesarean section    . Wisdom tooth extraction    . Cesarean section with bilateral tubal ligation Bilateral 11/07/2012    Procedure: Repeat CESAREAN SECTION of  twin "A"  baby girl   at  0420  APGAR 9/9 and twin "B" baby boy  at 74   APGAR  9/9 WITH BILATERAL TUBAL LIGATION;  Surgeon: Robley Fries, MD;  Location: [facility];  Service: Obstetrics;  Laterality: Bilateral;    Family History:  Family History  Problem Relation Age of Onset  . Diabetes Father   . Heart disease Father   . Cancer Maternal Grandmother   . Cancer Maternal Grandfather   . Hypertension Paternal Grandmother   . Miscarriages / Stillbirths Paternal Grandmother     Social History:  reports that she has never smoked. She has never used smokeless tobacco. She reports that she does not drink alcohol or use illicit drugs.  Allergies: Review of patient's allergies  indicates no known allergies.   Current Medications at time of admission:  Prior to Admission medications   Medication Sig Start Date End Date Taking? Authorizing Provider  acetaminophen (TYLENOL) 500 MG tablet Take 1,000 mg by mouth every 6 (six) hours as needed for pain.   Yes Historical Provider, MD  docusate sodium (COLACE) 100 MG capsule Take 100 mg by mouth 2 (two) times daily.   Yes Historical Provider, MD  Prenatal Vit-Fe Fumarate-FA (MULTIVITAMIN-PRENATAL) 27-0.8 MG TABS Take 1 tablet by mouth daily at 12 noon.   Yes Historical Provider, MD                  Intrapartum Course:  Admit for SROM - planned cesarean section for twins  Procedures: Cesarean section delivery on 11/07/2012 with delivery of  Twin (female/female) newborns by Dr Juliene Pina   See operative report for further details APGAR (1 MIN):    Loewenstein, Girl [place] [[id number]]  9   Yasaman, K[place]k [place] [[id number]]  9   APGAR (5 MINS):    Diehl, Girl [place] [[id number]]  9   Tanaiya, [place] [[id number]]  9    Postoperative / postpartum course:  Uncomplicated with discharge on POD 2 / mild ABL anemia   Physical Exam:   VSS: Temp:  [97.7 F (36.5 C)-98.7 F (37.1 C)] 97.7 F (36.5 C) (07/23 0600) Pulse Rate:  [79-90] 80 (07/23 0605) Resp:  [20] 20 (07/23 0600) BP: (117-160)/(78-79) 118/79 mmHg (  07/23 0605)  LABS:  Recent Labs  11/07/12 0311 11/08/12 0555  WBC 14.3* 13.0*  HGB 12.8 10.7*  PLT 123* 106*    General: pleasant / ambulatory Heart: RRR Lungs: clear  Abdomen: soft and non-tender / non-distended / active BS  Extremities: no edema / negative Homans  Dressing: intact honeycomb dressing Incision:  approximated with subcuticular / no erythema / no ecchymosis / no drainage  Discharge Instructions:  Discharged Condition: stable  Activity: pelvic rest and postoperative restrictions x 2   Diet: routine  Medications:    Medication List         acetaminophen 500 MG  tablet  Commonly known as:  TYLENOL  Take 1,000 mg by mouth every 6 (six) hours as needed for pain.     docusate sodium 100 MG capsule  Commonly known as:  COLACE  Take 100 mg by mouth 2 (two) times daily.     ibuprofen 600 MG tablet  Commonly known as:  ADVIL,MOTRIN  Take 1 tablet (600 mg total) by mouth every 6 (six) hours.     multivitamin-prenatal 27-0.8 MG Tabs  Take 1 tablet by mouth daily at 12 noon.     oxyCODONE-acetaminophen 5-325 MG per tablet  Commonly known as:  PERCOCET/ROXICET  Take 1-2 tablets by mouth every 4 (four) hours as needed.        Wound Care: keep clean and dry / remove honeycomb POD 5 Postpartum Instructions: [facility] discharge booklet - instructions reviewed  Discharge to: Home  Follow up :   [facility] in 6 weeks for routine postpartum visit with Mody                Signed: Marlinda Mike CNM, MSN, [facility] 11/09/2012, 8:31 AM

## 2012-11-09 NOTE — Progress Notes (Signed)
POSTOPERATIVE DAY # 2 S/P cesarean section - twins   S:         Reports feeling well - interested in early discharge if possible             Tolerating po intake / no nausea / no vomiting / + flatus / no BM             Bleeding is light             Pain controlled with motrin and percocet             Up ad lib / ambulatory/ voiding QS  Newborn breast- feeding twins  O:  VS: BP 118/79  Pulse 80  Temp(Src) 97.7 F (36.5 C) (Oral)  Resp 20  Ht 5\' 5"  (1.651 m)  Wt 84.823 kg (187 lb)  BMI 31.12 kg/m2  SpO2 98%   LABS:               Recent Labs  11/07/12 0311 11/08/12 0555  WBC 14.3* 13.0*  HGB 12.8 10.7*  PLT 123* 106*               Bloodtype: --/--/O POS, O POS (07/21 0981)  Rubella: Immune (02/05 0000)                                   Physical Exam:             Alert and Oriented X3  Lungs: Clear and unlabored  Heart: regular rate and rhythm / no mumurs  Abdomen: soft, non-tender, non-distended, active bowel sounds             Fundus: firm, non-tender, U-1             Dressing intact honeycomb              Incision:  approximated with subcuticular with steristrips / no erythema / no ecchymosis / no drainage  Extremities: no edema, no calf pain or tenderness  A:        POD # 2 S/P cesarean section             Mild ABL anemia  P:        Routine postoperative care              Early discharge if Peds discharges twins    Marlinda Mike CNM, MSN, [facility] 11/09/2012, 8:25 AM

## 2012-11-10 NOTE — Progress Notes (Addendum)
POD # 3 S/p elective c/s for twin pregnancy. Discharge intended yesterday but stayed o/n for observation of one baby. Baby is cleared for d/c today  Subjective: Pt reports feeling well/ Pain controlled with ibuprofen (OTC) Tolerating po/Voiding without problems/ No n/v/Flatus pos Activity: ad lib Bleeding is light Newborn info:  Information for the patient's newborn:  Rainee, [place]att Girl [place]  female Information for the patient's newborn:  Zhaniya, Swallows [[id number]]  female  / circ done on Tuesday/ Feeding: breast and bottle   Objective: VS: BP 120/73  Pulse 75  Temp(Src) 97.5 F (36.4 C) (Oral)  Resp 18  Ht 5\' 5"  (1.651 m)  Wt 187 lb (84.823 kg)  BMI 31.12 kg/m2  SpO2 98%  I&O: Intake/Output   None     LABS:  Recent Labs  11/08/12 0555  WBC 13.0*  HGB 10.7*  PLT 106*                             Physical Exam:  General: alert, cooperative and no distress CV: Regular rate and rhythm Resp: clear Abdomen: soft, nontender, normal bowel sounds Incision: well approximated, tegaderm honeycomb dressing still in place with subcuticular and steri-strip closure Uterine Fundus: firm, below umbilicus, nontender Lochia: minimal Ext: extremities normal, atraumatic, no cyanosis or edema    A/P: POD # 3/ G3P1113/ S/P C/Section d/t elective repeat with twin pregnancy Doing well and stable for discharge home Instructed to remove honeycomb dressing on Saturday and keep incision clean and dry after  RX's: Ibuprofen 600mg  po Q 6 hrs prn pain #30 Refill x 1 Percocet 5/325 1 - 2 tabs po every 6 hrs prn pain  #30 No refill      Signed: Bruna Potter, FNP student 11/10/2012, 9:23 AM  Reviewed: Arlana Lindau, MSN, [facility] 11/10/12 927am

## 2012-11-22 ENCOUNTER — Inpatient Hospital Stay (HOSPITAL_COMMUNITY): Admission: RE | Admit: 2012-11-22 | Payer: Managed Care, Other (non HMO) | Source: Ambulatory Visit

## 2012-11-24 ENCOUNTER — Inpatient Hospital Stay: Admit: 2012-11-24 | Payer: Managed Care, Other (non HMO) | Admitting: Obstetrics

## 2012-11-24 SURGERY — Surgical Case
Anesthesia: Spinal | Laterality: Bilateral

## 2013-02-25 ENCOUNTER — Encounter (HOSPITAL_COMMUNITY): Payer: Self-pay | Admitting: *Deleted

## 2013-02-25 ENCOUNTER — Inpatient Hospital Stay (HOSPITAL_COMMUNITY)
Admission: AD | Admit: 2013-02-25 | Discharge: 2013-02-25 | Disposition: A | Discharge status: Home / Self Care | Payer: Managed Care, Other (non HMO) | Source: Ambulatory Visit | Attending: Obstetrics and Gynecology | Admitting: Obstetrics and Gynecology

## 2013-02-25 DIAGNOSIS — L089 Local infection of the skin and subcutaneous tissue, unspecified: Secondary | ICD-10-CM

## 2013-02-25 DIAGNOSIS — O909 Complication of the puerperium, unspecified: Secondary | ICD-10-CM | POA: Insufficient documentation

## 2013-02-25 HISTORY — DX: Local infection of the skin and subcutaneous tissue, unspecified: L08.9

## 2013-02-25 MED ORDER — CEPHALEXIN 500 MG PO CAPS: 500.0000 mg | ORAL_CAPSULE | Freq: Two times a day (BID) | ORAL | Status: AC

## 2013-02-25 NOTE — MAU Note (Signed)
Patient presents with complaint of opened incision; 31/2 months post cesarean section; inspection of site reveals a pimple sized reddened area on the right side of incision that is oozing a clear fluid and a scant amt of blood; patient states site is sore and she has been using a bandaid to keep the area covered. Incision appears healed and well approximated

## 2013-02-25 NOTE — MAU Provider Note (Signed)
History     CSN: [id number]  Arrival date and time: 02/25/13 4098 Provider notification time: [phone] Provider at bedside: 0930       Chief Complaint  Patient presents with  . Incisional Pain   HPI  Ms. Leah Cook is a 37 y.o. [phone] female who is S/P cesarean delivery (2.5 months ago) presenting today  With complaints that a pea-sized portion of incision opened @ ~0300 this morning with a continuous leaking of bright, red bleeding and reddened skin.  She reports feeling a "knot" under the area a couple of days ago, but thought that was just how the incision  Healed.  Afebrile.  Past Medical History  Diagnosis Date  . PCOS (polycystic ovarian syndrome)   . Postpartum care following cesarean delivery (7/21) 11/07/2012    Past Surgical History  Procedure Laterality Date  . Cesarean section    . Wisdom tooth extraction    . Cesarean section with bilateral tubal ligation Bilateral 11/07/2012    Procedure: Repeat CESAREAN SECTION of  twin "A"  baby girl   at  0420  APGAR 9/9 and twin "B" baby boy  at 29   APGAR  9/9 WITH BILATERAL TUBAL LIGATION;  Surgeon: Robley Fries, MD;  Location: [facility];  Service: Obstetrics;  Laterality: Bilateral;    Family History  Problem Relation Age of Onset  . Diabetes Father   . Heart disease Father   . Cancer Maternal Grandmother   . Cancer Maternal Grandfather   . Hypertension Paternal Grandmother   . Miscarriages / Stillbirths Paternal Grandmother     History  Substance Use Topics  . Smoking status: Never Smoker   . Smokeless tobacco: Never Used  . Alcohol Use: No    Allergies: No Known Allergies  Prescriptions prior to admission  Medication Sig Dispense Refill  . acetaminophen (TYLENOL) 500 MG tablet Take 1,000 mg by mouth every 6 (six) hours as needed for pain.      . Multiple Vitamin (MULTIVITAMIN WITH MINERALS) TABS tablet Take 1 tablet by mouth daily.      . Pseudoephedrine-APAP-DM (DAYQUIL MULTI-SYMPTOM PO) Take 2  tablets by mouth every 4 (four) hours as needed (for cold symptoms).        Review of Systems  Constitutional: Negative.   HENT: Negative.   Eyes: Negative.   Respiratory: Negative.   Cardiovascular: Negative.   Gastrointestinal: Positive for abdominal pain.       Uncomfortable where opening on incision  Genitourinary: Negative.   Musculoskeletal: Negative.   Skin:       Pea-sized opening on Rt side of incision; continuous leaking bright red blood since 0300 - none since arrival  Neurological: Negative.   Endo/Heme/Allergies: Negative.   Psychiatric/Behavioral: Negative.    Physical Exam   Blood pressure 121/70, pulse 90, temperature 97.9 F (36.6 C), temperature source Oral, resp. rate 16, height 5\' 4"  (1.626 m), weight 75.411 kg (166 lb 4 oz), SpO2 100.00%.  Physical Exam  Constitutional: She is oriented to person, place, and time. She appears well-developed and well-nourished.  HENT:  Head: Normocephalic.  Eyes: EOM are normal.  Neck: Normal range of motion. Neck supple.  Cardiovascular: Normal rate, regular rhythm, normal heart sounds and intact distal pulses.   Respiratory: Effort normal and breath sounds normal.  GI: Soft. Bowel sounds are normal.  Genitourinary: No vaginal discharge found.  Musculoskeletal: Normal range of motion.  Neurological: She is alert and oriented to person, place, and time. She has normal reflexes.  Skin: Skin is warm and dry. There is erythema.  1.5cm of erythema noted around a <0.5cm non-draining opening on Rt edge of c/s incision (3cm from end of incision)  Psychiatric: She has a normal mood and affect. Her behavior is normal. Judgment and thought content normal.    MAU Course  Procedures  Application of peroxide and silver nitrate  Assessment and Plan  Local skin Infection vs. probable folliculitis / no drainage to send for culture - very superficial defect in skin / area cleaned with ~ 10 mL of hydrogen peroxide, silver nitrate  applied to skin edges; edges well- approximated with 1 steri-strip.  Patient tolerated well.  Rx for Keflex 500 mg po BID x 10 days / Follow-up with Dr. Juliene Pina prn.  *Dr. Cherly Hensen notified of plan - agrees   Kenard Gower, MSN, CNM 02/25/2013, 9:48 AM

## 2013-02-28 ENCOUNTER — Encounter (HOSPITAL_COMMUNITY): Payer: Self-pay | Admitting: Obstetrics and Gynecology

## 2014-02-19 ENCOUNTER — Encounter (HOSPITAL_COMMUNITY): Payer: Self-pay | Admitting: Obstetrics and Gynecology

## 2014-05-21 ENCOUNTER — Ambulatory Visit
Admission: RE | Admit: 2014-05-21 | Discharge: 2014-05-21 | Disposition: A | Discharge status: Home / Self Care | Payer: Managed Care, Other (non HMO) | Source: Ambulatory Visit | Attending: Family Medicine | Admitting: Family Medicine

## 2014-05-21 ENCOUNTER — Other Ambulatory Visit: Payer: Self-pay | Admitting: Family Medicine

## 2014-05-21 DIAGNOSIS — R609 Edema, unspecified: Secondary | ICD-10-CM

## 2014-05-21 DIAGNOSIS — M79671 Pain in right foot: Secondary | ICD-10-CM

## 2016-05-14 IMAGING — CR DG FOOT COMPLETE 3+V*R*
3 series · 3 of 3 positions shown · non-contrast
Comparison: None.

CLINICAL DATA: Episodic pain in the right foot over the past month
especially along the metatarsophalangeal joints of the second
through fourth toes.

EXAM:
RIGHT FOOT COMPLETE - 3+ VIEW

[view not recorded (1 of 3)]
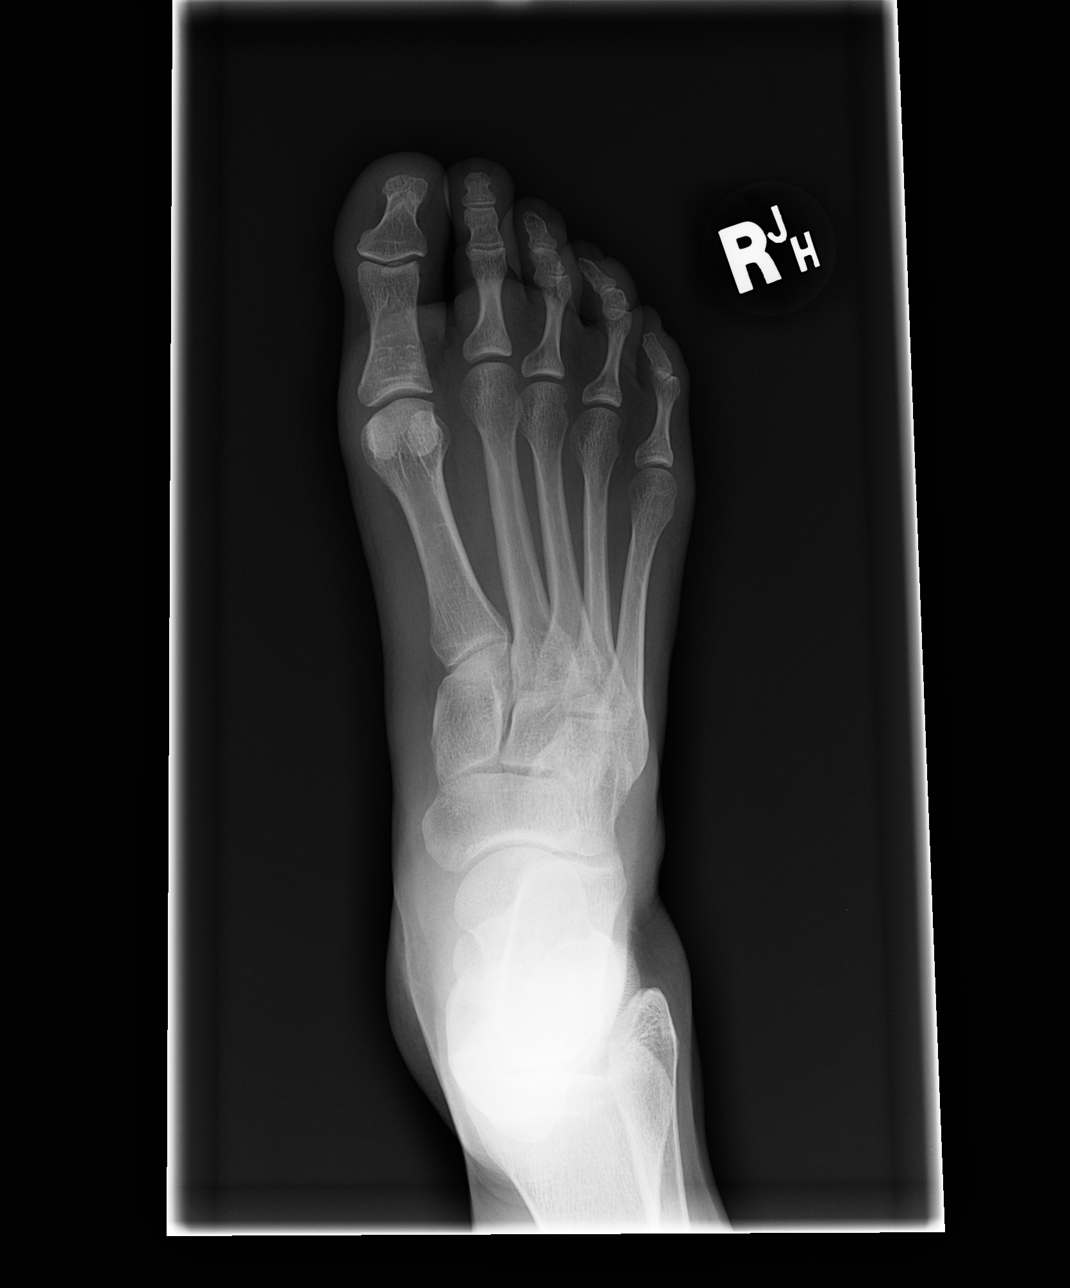

[view not recorded (2 of 3)]
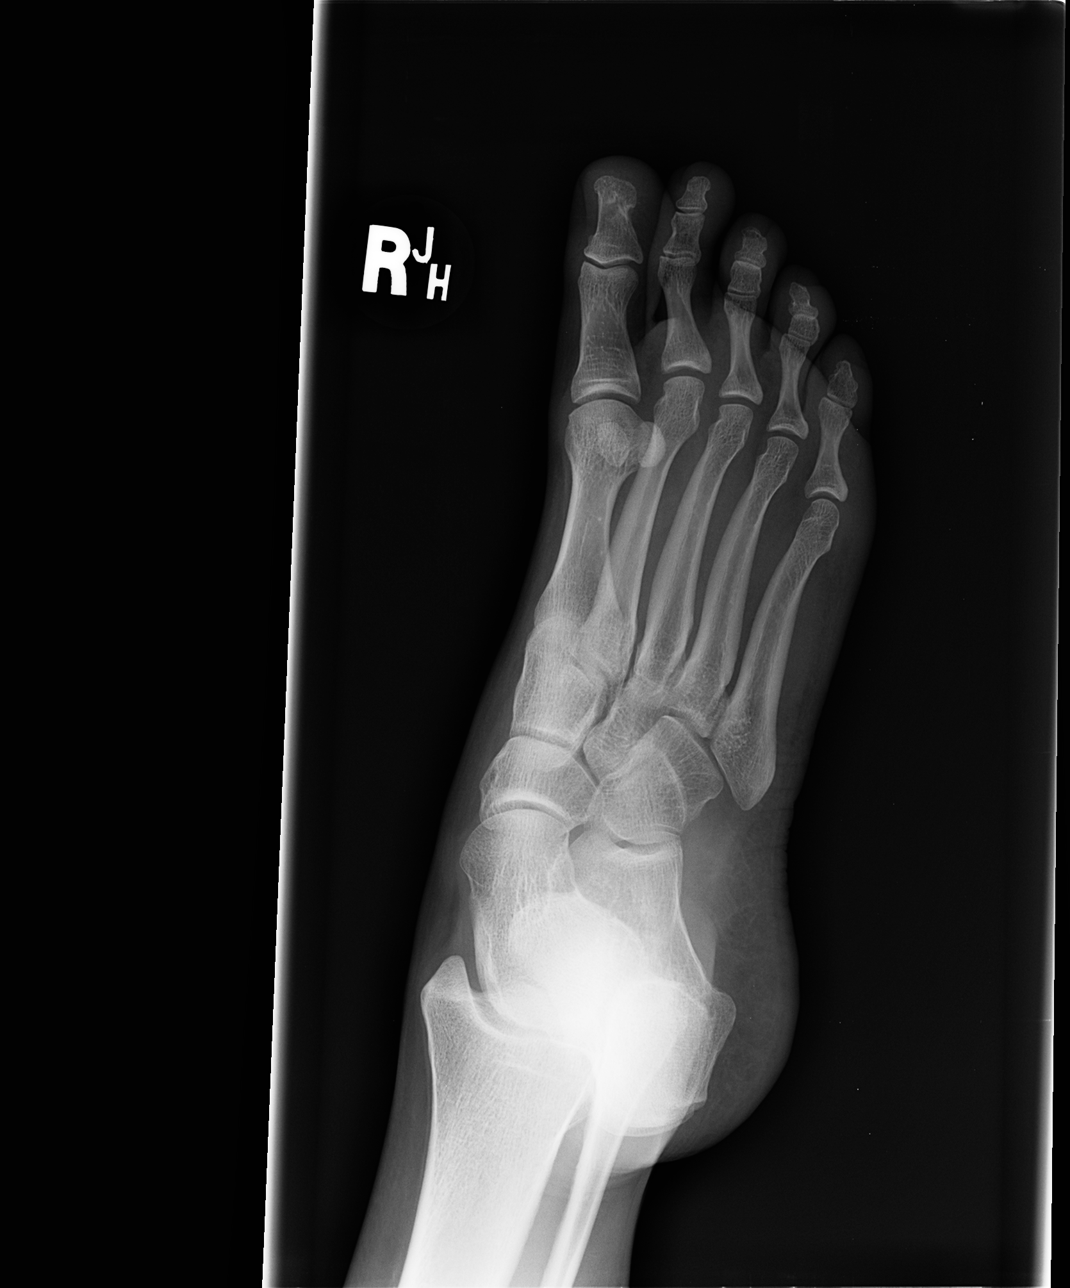

[view not recorded (3 of 3)]
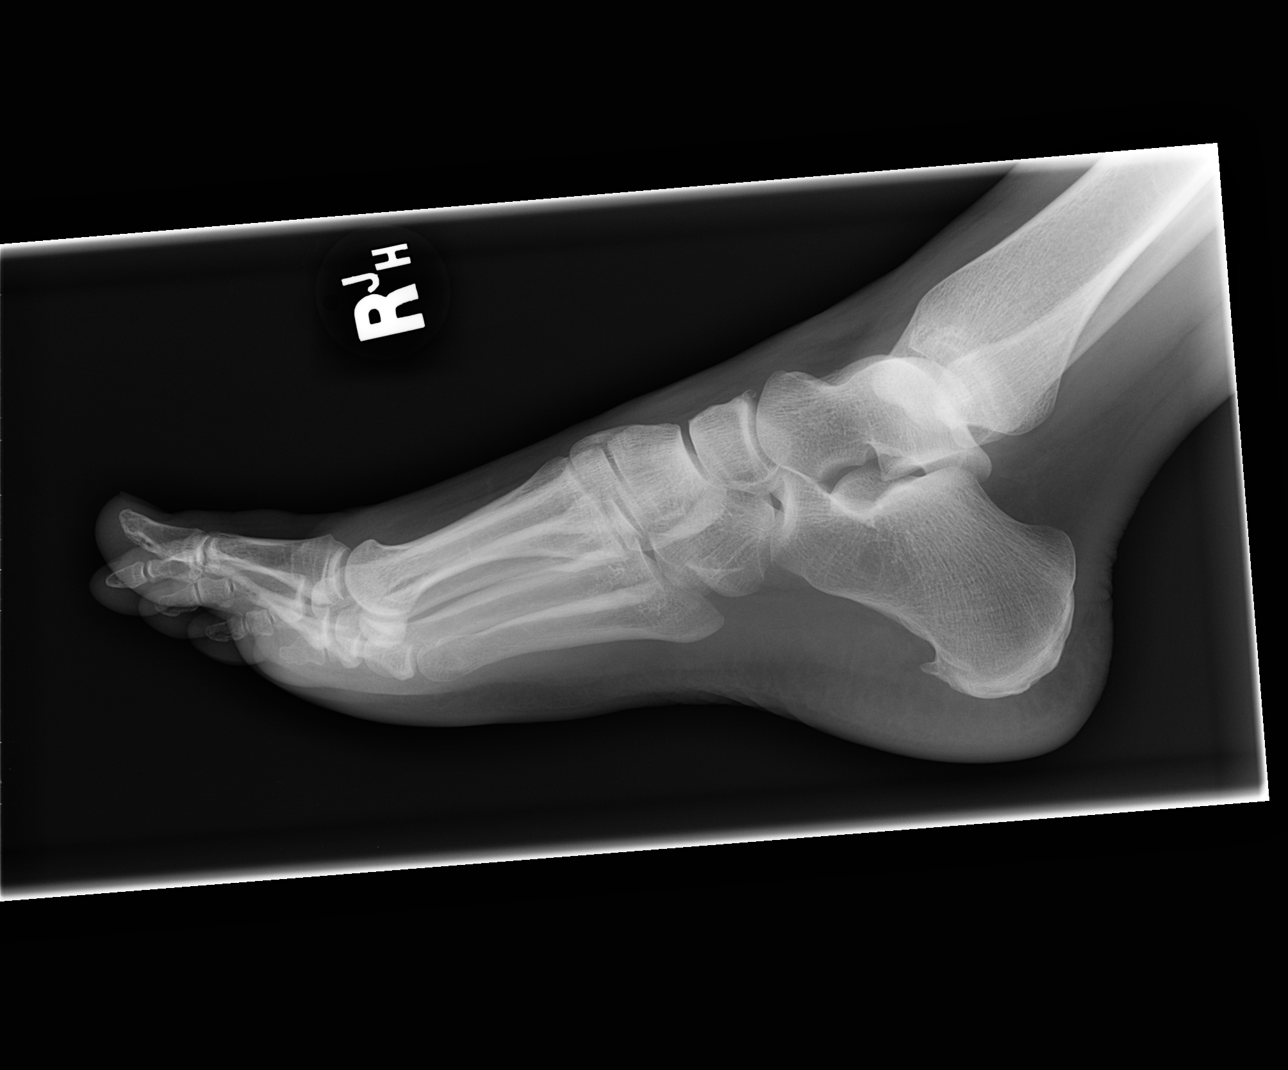

[3 of 3 positions shown; findings below may reference images not displayed]

FINDINGS: Lisfranc joint alignment normal. No significant bony abnormality
aside from a plantar calcaneal spur. No gas in the soft tissues.
IMPRESSION: 1. Plantar calcaneal spur. No other significant abnormalities. If
there is specific concern for Morton's neuroma or infection, MRI of
the forefoot with and without contrast might be considered.

## 2016-10-26 DIAGNOSIS — Z131 Encounter for screening for diabetes mellitus: Secondary | ICD-10-CM | POA: Diagnosis not present

## 2016-10-26 DIAGNOSIS — Z0000 Encounter for general adult medical examination without abnormal findings: Secondary | ICD-10-CM | POA: Diagnosis not present

## 2016-10-26 DIAGNOSIS — Z13220 Encounter for screening for lipoid disorders: Secondary | ICD-10-CM | POA: Diagnosis not present

## 2016-12-30 DIAGNOSIS — Z6828 Body mass index (BMI) 28.0-28.9, adult: Secondary | ICD-10-CM | POA: Diagnosis not present

## 2016-12-30 DIAGNOSIS — Z1151 Encounter for screening for human papillomavirus (HPV): Secondary | ICD-10-CM | POA: Diagnosis not present

## 2016-12-30 DIAGNOSIS — Z01419 Encounter for gynecological examination (general) (routine) without abnormal findings: Secondary | ICD-10-CM | POA: Diagnosis not present

## 2017-07-12 DIAGNOSIS — B373 Candidiasis of vulva and vagina: Secondary | ICD-10-CM | POA: Diagnosis not present

## 2017-07-12 DIAGNOSIS — L292 Pruritus vulvae: Secondary | ICD-10-CM | POA: Diagnosis not present

## 2018-01-13 DIAGNOSIS — Z1231 Encounter for screening mammogram for malignant neoplasm of breast: Secondary | ICD-10-CM | POA: Diagnosis not present

## 2018-01-13 DIAGNOSIS — Z23 Encounter for immunization: Secondary | ICD-10-CM | POA: Diagnosis not present

## 2018-01-13 DIAGNOSIS — Z6827 Body mass index (BMI) 27.0-27.9, adult: Secondary | ICD-10-CM | POA: Diagnosis not present

## 2018-01-13 DIAGNOSIS — Z01419 Encounter for gynecological examination (general) (routine) without abnormal findings: Secondary | ICD-10-CM | POA: Diagnosis not present

## 2018-02-02 DIAGNOSIS — Z0000 Encounter for general adult medical examination without abnormal findings: Secondary | ICD-10-CM | POA: Diagnosis not present

## 2018-02-08 DIAGNOSIS — Z131 Encounter for screening for diabetes mellitus: Secondary | ICD-10-CM | POA: Diagnosis not present

## 2018-02-08 DIAGNOSIS — Z13220 Encounter for screening for lipoid disorders: Secondary | ICD-10-CM | POA: Diagnosis not present

## 2019-02-01 DIAGNOSIS — Z1231 Encounter for screening mammogram for malignant neoplasm of breast: Secondary | ICD-10-CM | POA: Diagnosis not present

## 2019-02-01 DIAGNOSIS — Z6828 Body mass index (BMI) 28.0-28.9, adult: Secondary | ICD-10-CM | POA: Diagnosis not present

## 2019-02-01 DIAGNOSIS — Z01419 Encounter for gynecological examination (general) (routine) without abnormal findings: Secondary | ICD-10-CM | POA: Diagnosis not present

## 2019-02-20 DIAGNOSIS — Z13220 Encounter for screening for lipoid disorders: Secondary | ICD-10-CM | POA: Diagnosis not present

## 2019-02-20 DIAGNOSIS — Z0000 Encounter for general adult medical examination without abnormal findings: Secondary | ICD-10-CM | POA: Diagnosis not present

## 2019-02-20 DIAGNOSIS — Z131 Encounter for screening for diabetes mellitus: Secondary | ICD-10-CM | POA: Diagnosis not present

## 2020-03-11 DIAGNOSIS — Z13220 Encounter for screening for lipoid disorders: Secondary | ICD-10-CM | POA: Diagnosis not present

## 2020-03-11 DIAGNOSIS — Z0000 Encounter for general adult medical examination without abnormal findings: Secondary | ICD-10-CM | POA: Diagnosis not present

## 2020-03-11 DIAGNOSIS — Z131 Encounter for screening for diabetes mellitus: Secondary | ICD-10-CM | POA: Diagnosis not present

## 2020-03-13 DIAGNOSIS — Z1231 Encounter for screening mammogram for malignant neoplasm of breast: Secondary | ICD-10-CM | POA: Diagnosis not present

## 2020-03-13 DIAGNOSIS — Z6828 Body mass index (BMI) 28.0-28.9, adult: Secondary | ICD-10-CM | POA: Diagnosis not present

## 2020-03-13 DIAGNOSIS — Z1151 Encounter for screening for human papillomavirus (HPV): Secondary | ICD-10-CM | POA: Diagnosis not present

## 2020-03-13 DIAGNOSIS — Z01419 Encounter for gynecological examination (general) (routine) without abnormal findings: Secondary | ICD-10-CM | POA: Diagnosis not present

## 2020-08-20 DIAGNOSIS — N921 Excessive and frequent menstruation with irregular cycle: Secondary | ICD-10-CM | POA: Diagnosis not present

## 2020-08-20 DIAGNOSIS — N857 Hematometra: Secondary | ICD-10-CM | POA: Diagnosis not present

## 2020-08-20 DIAGNOSIS — N939 Abnormal uterine and vaginal bleeding, unspecified: Secondary | ICD-10-CM | POA: Diagnosis not present

## 2020-09-05 DIAGNOSIS — N857 Hematometra: Secondary | ICD-10-CM | POA: Diagnosis not present

## 2021-01-27 DIAGNOSIS — N762 Acute vulvitis: Secondary | ICD-10-CM | POA: Diagnosis not present

## 2021-01-27 DIAGNOSIS — N898 Other specified noninflammatory disorders of vagina: Secondary | ICD-10-CM | POA: Diagnosis not present

## 2021-03-17 DIAGNOSIS — N762 Acute vulvitis: Secondary | ICD-10-CM | POA: Diagnosis not present

## 2021-03-17 DIAGNOSIS — Z1231 Encounter for screening mammogram for malignant neoplasm of breast: Secondary | ICD-10-CM | POA: Diagnosis not present

## 2021-03-17 DIAGNOSIS — Z6828 Body mass index (BMI) 28.0-28.9, adult: Secondary | ICD-10-CM | POA: Diagnosis not present

## 2021-03-17 DIAGNOSIS — N898 Other specified noninflammatory disorders of vagina: Secondary | ICD-10-CM | POA: Diagnosis not present

## 2021-03-17 DIAGNOSIS — Z01419 Encounter for gynecological examination (general) (routine) without abnormal findings: Secondary | ICD-10-CM | POA: Diagnosis not present

## 2021-03-19 DIAGNOSIS — Z131 Encounter for screening for diabetes mellitus: Secondary | ICD-10-CM | POA: Diagnosis not present

## 2021-03-19 DIAGNOSIS — Z13220 Encounter for screening for lipoid disorders: Secondary | ICD-10-CM | POA: Diagnosis not present

## 2021-03-19 DIAGNOSIS — Z0000 Encounter for general adult medical examination without abnormal findings: Secondary | ICD-10-CM | POA: Diagnosis not present

## 2021-03-19 DIAGNOSIS — E282 Polycystic ovarian syndrome: Secondary | ICD-10-CM | POA: Diagnosis not present

## 2021-11-21 ENCOUNTER — Other Ambulatory Visit: Payer: Self-pay

## 2021-11-21 ENCOUNTER — Emergency Department (HOSPITAL_BASED_OUTPATIENT_CLINIC_OR_DEPARTMENT_OTHER)
Admission: EM | Admit: 2021-11-21 | Discharge: 2021-11-21 | Disposition: A | Discharge status: Home / Self Care | Payer: BLUE CROSS/BLUE SHIELD | Attending: Emergency Medicine | Admitting: Emergency Medicine

## 2021-11-21 ENCOUNTER — Encounter (HOSPITAL_BASED_OUTPATIENT_CLINIC_OR_DEPARTMENT_OTHER): Payer: Self-pay | Admitting: Obstetrics and Gynecology

## 2021-11-21 ENCOUNTER — Emergency Department (HOSPITAL_BASED_OUTPATIENT_CLINIC_OR_DEPARTMENT_OTHER): Payer: BLUE CROSS/BLUE SHIELD

## 2021-11-21 ENCOUNTER — Emergency Department (HOSPITAL_BASED_OUTPATIENT_CLINIC_OR_DEPARTMENT_OTHER): Payer: BLUE CROSS/BLUE SHIELD | Admitting: Radiology

## 2021-11-21 DIAGNOSIS — S301XXA Contusion of abdominal wall, initial encounter: Secondary | ICD-10-CM | POA: Insufficient documentation

## 2021-11-21 DIAGNOSIS — R079 Chest pain, unspecified: Secondary | ICD-10-CM | POA: Diagnosis not present

## 2021-11-21 DIAGNOSIS — Z041 Encounter for examination and observation following transport accident: Secondary | ICD-10-CM | POA: Diagnosis not present

## 2021-11-21 DIAGNOSIS — R0789 Other chest pain: Secondary | ICD-10-CM | POA: Diagnosis not present

## 2021-11-21 DIAGNOSIS — R58 Hemorrhage, not elsewhere classified: Secondary | ICD-10-CM

## 2021-11-21 DIAGNOSIS — D72829 Elevated white blood cell count, unspecified: Secondary | ICD-10-CM | POA: Diagnosis not present

## 2021-11-21 DIAGNOSIS — S3991XA Unspecified injury of abdomen, initial encounter: Secondary | ICD-10-CM | POA: Diagnosis not present

## 2021-11-21 DIAGNOSIS — Y92410 Unspecified street and highway as the place of occurrence of the external cause: Secondary | ICD-10-CM | POA: Insufficient documentation

## 2021-11-21 LAB — COMPREHENSIVE METABOLIC PANEL
ALT: 9 U/L (ref 0–44)
AST: 14 U/L — ABNORMAL LOW (ref 15–41)
Albumin: 4.1 g/dL (ref 3.5–5.0)
Alkaline Phosphatase: 48 U/L (ref 38–126)
Anion gap: 8 (ref 5–15)
BUN: 11 mg/dL (ref 6–20)
CO2: 23 mmol/L (ref 22–32)
Calcium: 8.9 mg/dL (ref 8.9–10.3)
Chloride: 104 mmol/L (ref 98–111)
Creatinine, Ser: 0.8 mg/dL (ref 0.44–1.00)
GFR, Estimated: >60 mL/min (ref >60)
Glucose, Bld: 96 mg/dL (ref 70–99)
Potassium: 4.3 mmol/L (ref 3.5–5.1)
Sodium: 135 mmol/L (ref 135–145)
Total Bilirubin: 0.4 mg/dL (ref 0.3–1.2)
Total Protein: 7.5 g/dL (ref 6.5–8.1)

## 2021-11-21 LAB — CBC WITH DIFFERENTIAL/PLATELET
Abs Immature Granulocytes: 0.04 10*3/uL (ref 0.00–0.07)
Basophils Absolute: 0 10*3/uL (ref 0.0–0.1)
Basophils Relative: 0 %
Eosinophils Absolute: 0.3 10*3/uL (ref 0.0–0.5)
Eosinophils Relative: 3 %
HCT: 40.4 % (ref 36.0–46.0)
Hemoglobin: 13.6 g/dL (ref 12.0–15.0)
Immature Granulocytes: 0 %
Lymphocytes Relative: 23 %
Lymphs Abs: 2.5 10*3/uL (ref 0.7–4.0)
MCH: 28.5 pg (ref 26.0–34.0)
MCHC: 33.7 g/dL (ref 30.0–36.0)
MCV: 84.7 fL (ref 80.0–100.0)
Monocytes Absolute: 0.6 10*3/uL (ref 0.1–1.0)
Monocytes Relative: 6 %
Neutro Abs: 7.3 10*3/uL (ref 1.7–7.7)
Neutrophils Relative %: 68 %
Platelets: 243 10*3/uL (ref 150–400)
RBC: 4.77 MIL/uL (ref 3.87–5.11)
RDW: 13.2 % (ref 11.5–15.5)
WBC: 10.8 10*3/uL — ABNORMAL HIGH (ref 4.0–10.5)
nRBC: 0 % (ref 0.0–0.2)

## 2021-11-21 LAB — PREGNANCY, URINE: Preg Test, Ur: NEGATIVE

## 2021-11-21 MED ORDER — IOHEXOL 300 MG/ML  SOLN
100.0000 mL | Freq: Once | INTRAMUSCULAR | Status: AC | PRN
  Administered 2021-11-21: 100 mL via INTRAVENOUS
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
## 2021-11-21 NOTE — ED Notes (Signed)
Pt d/c home per order. Discharge summary reviewed with pt, pt verbalizes understanding. Ambulatory off unit. No s/s of acute distress noted at discharge.
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
## 2021-11-21 NOTE — Discharge Instructions (Addendum)
The pain you are experiencing is likely due to muscle strain, you may take Ibuprofen or Naprosyn as needed for pain management. Do not combine with any pain reliever other than tylenol.  You may also use ice and heat, and over-the-counter remedies such as Biofreeze gel or salon pas lidocaine patches. The muscle soreness should improve over the next week. Follow up with your family doctor in the next week for a recheck if you are still having symptoms. Return to ED if pain is worsening, you develop weakness or numbness of extremities, or new or concerning symptoms develop.

## 2021-11-21 NOTE — ED Notes (Signed)
Patient transported to X-ray 

## 2021-11-28 DIAGNOSIS — R030 Elevated blood-pressure reading, without diagnosis of hypertension: Secondary | ICD-10-CM | POA: Diagnosis not present

## 2021-11-28 DIAGNOSIS — B653 Cercarial dermatitis: Secondary | ICD-10-CM | POA: Diagnosis not present

## 2022-04-02 DIAGNOSIS — E785 Hyperlipidemia, unspecified: Secondary | ICD-10-CM | POA: Diagnosis not present

## 2022-04-02 DIAGNOSIS — L309 Dermatitis, unspecified: Secondary | ICD-10-CM | POA: Diagnosis not present

## 2022-04-02 DIAGNOSIS — R030 Elevated blood-pressure reading, without diagnosis of hypertension: Secondary | ICD-10-CM | POA: Diagnosis not present

## 2022-04-02 DIAGNOSIS — Z0000 Encounter for general adult medical examination without abnormal findings: Secondary | ICD-10-CM | POA: Diagnosis not present

## 2022-04-29 DIAGNOSIS — I10 Essential (primary) hypertension: Secondary | ICD-10-CM | POA: Diagnosis not present

## 2022-05-21 DIAGNOSIS — Z01411 Encounter for gynecological examination (general) (routine) with abnormal findings: Secondary | ICD-10-CM | POA: Diagnosis not present

## 2022-05-21 DIAGNOSIS — Z6828 Body mass index (BMI) 28.0-28.9, adult: Secondary | ICD-10-CM | POA: Diagnosis not present

## 2022-05-21 DIAGNOSIS — Z1231 Encounter for screening mammogram for malignant neoplasm of breast: Secondary | ICD-10-CM | POA: Diagnosis not present

## 2022-05-21 DIAGNOSIS — I10 Essential (primary) hypertension: Secondary | ICD-10-CM | POA: Diagnosis not present

## 2022-05-21 DIAGNOSIS — E282 Polycystic ovarian syndrome: Secondary | ICD-10-CM | POA: Diagnosis not present

## 2022-05-21 DIAGNOSIS — N762 Acute vulvitis: Secondary | ICD-10-CM | POA: Diagnosis not present

## 2023-04-30 DIAGNOSIS — Z23 Encounter for immunization: Secondary | ICD-10-CM | POA: Diagnosis not present

## 2023-04-30 DIAGNOSIS — Z0000 Encounter for general adult medical examination without abnormal findings: Secondary | ICD-10-CM | POA: Diagnosis not present

## 2023-04-30 DIAGNOSIS — R030 Elevated blood-pressure reading, without diagnosis of hypertension: Secondary | ICD-10-CM | POA: Diagnosis not present

## 2023-04-30 DIAGNOSIS — Z131 Encounter for screening for diabetes mellitus: Secondary | ICD-10-CM | POA: Diagnosis not present

## 2023-04-30 DIAGNOSIS — E785 Hyperlipidemia, unspecified: Secondary | ICD-10-CM | POA: Diagnosis not present

## 2023-04-30 DIAGNOSIS — Z8349 Family history of other endocrine, nutritional and metabolic diseases: Secondary | ICD-10-CM | POA: Diagnosis not present

## 2023-04-30 DIAGNOSIS — E282 Polycystic ovarian syndrome: Secondary | ICD-10-CM | POA: Diagnosis not present

## 2023-05-24 DIAGNOSIS — J392 Other diseases of pharynx: Secondary | ICD-10-CM | POA: Diagnosis not present

## 2023-05-31 DIAGNOSIS — Z124 Encounter for screening for malignant neoplasm of cervix: Secondary | ICD-10-CM | POA: Diagnosis not present

## 2023-05-31 DIAGNOSIS — Z01419 Encounter for gynecological examination (general) (routine) without abnormal findings: Secondary | ICD-10-CM | POA: Diagnosis not present

## 2023-05-31 DIAGNOSIS — Z1231 Encounter for screening mammogram for malignant neoplasm of breast: Secondary | ICD-10-CM | POA: Diagnosis not present

## 2023-05-31 DIAGNOSIS — Z1331 Encounter for screening for depression: Secondary | ICD-10-CM | POA: Diagnosis not present

## 2023-05-31 DIAGNOSIS — R7302 Impaired glucose tolerance (oral): Secondary | ICD-10-CM | POA: Diagnosis not present

## 2023-06-24 ENCOUNTER — Encounter: Payer: Self-pay | Admitting: Gastroenterology

## 2023-07-30 ENCOUNTER — Ambulatory Visit

## 2023-07-30 VITALS — Ht 64.0 in | Wt 170.0 lb

## 2023-07-30 DIAGNOSIS — Z1211 Encounter for screening for malignant neoplasm of colon: Secondary | ICD-10-CM

## 2023-07-30 MED ORDER — NA SULFATE-K SULFATE-MG SULF 17.5-3.13-1.6 GM/177ML PO SOLN: 1.0000 | Freq: Once | ORAL | 0 refills | Status: AC

## 2023-07-30 NOTE — Progress Notes (Signed)
 No egg or soy allergy known to patient  No issues known to pt with past sedation with any surgeries or procedures Patient denies ever being told they had issues or difficulty with intubation  No FH of Malignant Hyperthermia Pt is not on diet pills Pt is not on  home 02  Pt is not on blood thinners  Pt has mild issues with constipation , instructed to take Miralax , 1 capful, 7 days before procedure No A fib or A flutter Have any cardiac testing pending--No Pt can ambulate  Pt denies use of chewing tobacco Discussed diabetic I weight loss medication holds Discussed NSAID holds Checked BMI Pt instructed to use Single[employer] or GoodRx for a price reduction on prep  Patient's chart reviewed by Cathlyn Parsons CNRA prior to previsit and patient appropriate for the [facility].  Pre visit completed and red dot placed by patient's name on their procedure day (on provider's schedule).

## 2023-08-18 ENCOUNTER — Encounter: Payer: Self-pay | Admitting: Gastroenterology

## 2023-08-30 ENCOUNTER — Encounter: Payer: Self-pay | Admitting: Gastroenterology

## 2023-08-30 ENCOUNTER — Ambulatory Visit (AMBULATORY_SURGERY_CENTER): Admitting: Gastroenterology

## 2023-08-30 VITALS — BP 134/80 | HR 82 | Temp 97.7°F | Resp 13 | Ht 64.0 in | Wt 170.0 lb

## 2023-08-30 DIAGNOSIS — D123 Benign neoplasm of transverse colon: Secondary | ICD-10-CM | POA: Diagnosis not present

## 2023-08-30 DIAGNOSIS — Z1211 Encounter for screening for malignant neoplasm of colon: Secondary | ICD-10-CM

## 2023-08-30 MED ORDER — SODIUM CHLORIDE 0.9 % IV SOLN: 500.0000 mL | Freq: Once | INTRAVENOUS | Status: AC

## 2023-08-30 NOTE — Patient Instructions (Signed)
Resume previous diet and medications. Awaiting pathology results. Repeat Colonoscopy date to be determined based on pathology results.  YOU HAD AN ENDOSCOPIC PROCEDURE TODAY AT THE [facility]:   Refer to the procedure report that was given to you for any specific questions about what was found during the examination.  If the procedure report does not answer your questions, please call your gastroenterologist to clarify.  If you requested that your care partner not be given the details of your procedure findings, then the procedure report has been included in a sealed envelope for you to review at your convenience later.  YOU SHOULD EXPECT: Some feelings of bloating in the abdomen. Passage of more gas than usual.  Walking can help get rid of the air that was put into your GI tract during the procedure and reduce the bloating. If you had a lower endoscopy (such as a colonoscopy or flexible sigmoidoscopy) you may notice spotting of blood in your stool or on the toilet paper. If you underwent a bowel prep for your procedure, you may not have a normal bowel movement for a few days.  Please Note:  You might notice some irritation and congestion in your nose or some drainage.  This is from the oxygen used during your procedure.  There is no need for concern and it should clear up in a day or so.  SYMPTOMS TO REPORT IMMEDIATELY:  Following lower endoscopy (colonoscopy or flexible sigmoidoscopy):  Excessive amounts of blood in the stool  Significant tenderness or worsening of abdominal pains  Swelling of the abdomen that is new, acute  Fever of 100F or higher   For urgent or emergent issues, a gastroenterologist can be reached at any hour by calling [phone]. Do not use MyChart messaging for urgent concerns.    DIET:  We do recommend a small meal at first, but then you may proceed to your regular diet.  Drink plenty of fluids but you should avoid alcoholic beverages for 24  hours.  ACTIVITY:  You should plan to take it easy for the rest of today and you should NOT DRIVE or use heavy machinery until tomorrow (because of the sedation medicines used during the test).    FOLLOW UP: Our staff will call the number listed on your records the next business day following your procedure.  We will call around 7:15- 8:00 am to check on you and address any questions or concerns that you may have regarding the information given to you following your procedure. If we do not reach you, we will leave a message.     If any biopsies were taken you will be contacted by phone or by letter within the next 1-3 weeks.  Please call us at [phone] if you have not heard about the biopsies in 3 weeks.    SIGNATURES/CONFIDENTIALITY: You and/or your care partner have signed paperwork which will be entered into your electronic medical record.  These signatures attest to the fact that that the information above on your After Visit Summary has been reviewed and is understood.  Full responsibility of the confidentiality of this discharge information lies with you and/or your care-partner. 

## 2023-08-30 NOTE — Progress Notes (Signed)
 Called to room to assist during endoscopic procedure.  Patient ID and intended procedure confirmed with present staff. Received instructions for my participation in the procedure from the performing physician.

## 2023-08-30 NOTE — Progress Notes (Signed)
 Pt resting comfortably. VSS. Airway intact. SBAR complete to RN. All questions answered.

## 2023-08-30 NOTE — Progress Notes (Signed)
 Pt's states no medical or surgical changes since previsit or office visit.

## 2023-08-30 NOTE — Op Note (Signed)
 [facility] Patient Name: Leah Cook Procedure Date: 08/30/2023 11:19 AM MRN: [id number] Endoscopist: Geralyn Knee E. Cherryl Corona , MD, [id number] Age: 48 Referring MD:  Date of Birth: 1975-06-16 Gender: Female Account #: [id number] Procedure:                Colonoscopy Indications:              Screening for colorectal malignant neoplasm Medicines:                Monitored Anesthesia Care Procedure:                Pre-Anesthesia Assessment:                           - Prior to the procedure, a History and Physical                            was performed, and patient medications and                            allergies were reviewed. The patient's tolerance of                            previous anesthesia was also reviewed. The risks                            and benefits of the procedure and the sedation                            options and risks were discussed with the patient.                            All questions were answered, and informed consent                            was obtained. Prior Anticoagulants: The patient has                            taken no anticoagulant or antiplatelet agents. ASA                            Grade Assessment: II - A patient with mild systemic                            disease. After reviewing the risks and benefits,                            the patient was deemed in satisfactory condition to                            undergo the procedure.                           After obtaining informed consent, the colonoscope  was passed under direct vision. Throughout the                            procedure, the patient's blood pressure, pulse, and                            oxygen saturations were monitored continuously. The                            CF [id number] #[id number] was introduced through the anus                            and advanced to the the cecum, identified by                             appendiceal orifice and ileocecal valve. The                            colonoscopy was performed without difficulty. The                            patient tolerated the procedure well. The quality                            of the bowel preparation was good. The ileocecal                            valve, appendiceal orifice, and rectum were                            photographed. The bowel preparation used was SUPREP                            via split dose instruction. Scope In: 11:33:54 AM Scope Out: 11:51:47 AM Scope Withdrawal Time: 0 hours 13 minutes 24 seconds  Total Procedure Duration: 0 hours 17 minutes 53 seconds  Findings:                 The perianal and digital rectal examinations were                            normal. Pertinent negatives include normal                            sphincter tone and no palpable rectal lesions.                           A 4 mm polyp was found in the proximal transverse                            colon. The polyp was sessile. The polyp was removed                            with a cold snare. Resection  and retrieval were                            complete. Estimated blood loss was minimal.                           The exam was otherwise normal throughout the                            examined colon.                           The retroflexed view of the distal rectum and anal                            verge was normal and showed no anal or rectal                            abnormalities. Complications:            No immediate complications. Estimated Blood Loss:     Estimated blood loss was minimal. Impression:               - One 4 mm polyp in the proximal transverse colon,                            removed with a cold snare. Resected and retrieved.                           - The distal rectum and anal verge are normal on                            retroflexion view. Recommendation:           - Patient has a contact number available  for                            emergencies. The signs and symptoms of potential                            delayed complications were discussed with the                            patient. Return to normal activities tomorrow.                            Written discharge instructions were provided to the                            patient.                           - Resume previous diet.                           - Continue present medications.                           -  Await pathology results.                           - Repeat colonoscopy (date not yet determined) for                            surveillance based on pathology results. Kayode Petion E. Cherryl Corona, MD 08/30/2023 11:56:16 AM This report has been signed electronically.

## 2023-08-30 NOTE — Progress Notes (Signed)
 [facility] History and Physical   Primary Care Physician:  Pridgen, Taylar, NP   Reason for Procedure:   Colon cancer screening  Plan:    Screening colonoscopy     HPI: Leah  Cook is a 48 y.o. female undergoing initial average risk screening colonoscopy.  She has no family history of colon cancer and no chronic GI symptoms.    Past Medical History:  Diagnosis Date   Local skin infection 02/25/2013   PCOS (polycystic ovarian syndrome)    Postpartum care following cesarean delivery (7/21) 11/07/2012    Past Surgical History:  Procedure Laterality Date   CESAREAN SECTION     CESAREAN SECTION WITH BILATERAL TUBAL LIGATION Bilateral 11/07/2012   Procedure: Repeat CESAREAN SECTION of  twin "A"  baby girl   at  0420  APGAR 9/9 and twin "B" baby boy  at 68   APGAR  9/9 WITH BILATERAL TUBAL LIGATION;  Surgeon: Shasta Deist, MD;  Location: [facility];  Service: Obstetrics;  Laterality: Bilateral;   COLONOSCOPY  2002   NORMAL AT [facility]   WISDOM TOOTH EXTRACTION      Prior to Admission medications   Medication Sig Start Date End Date Taking? Authorizing Provider  Drospirenone (SLYND) 4 MG TABS Take 1 tablet by mouth daily.   Yes [provider]  Multiple Vitamin (MULTIVITAMIN WITH MINERALS) TABS tablet Take 1 tablet by mouth daily.   Yes [provider]  OVER THE COUNTER MEDICATION Take 1 capsule by mouth daily. Tumeric   Yes [provider]  Probiotic Product (PROBIOTIC PEARLS WOMENS) CAPS Take 1 tablet by mouth daily.   Yes [provider]  psyllium (METAMUCIL) 58.6 % powder Take 1 packet by mouth daily.   Yes [provider]  clobetasol ointment (TEMOVATE) 0.05 % Apply 1 Application topically as directed. 05/21/22   [provider]  desonide (DESOWEN) 0.05 % ointment Apply 1 Application topically 2 (two) times daily. PRN    [provider]    Current Outpatient Medications  Medication Sig Dispense  Refill   Drospirenone (SLYND) 4 MG TABS Take 1 tablet by mouth daily.     Multiple Vitamin (MULTIVITAMIN WITH MINERALS) TABS tablet Take 1 tablet by mouth daily.     OVER THE COUNTER MEDICATION Take 1 capsule by mouth daily. Tumeric     Probiotic Product (PROBIOTIC PEARLS WOMENS) CAPS Take 1 tablet by mouth daily.     psyllium (METAMUCIL) 58.6 % powder Take 1 packet by mouth daily.     clobetasol ointment (TEMOVATE) 0.05 % Apply 1 Application topically as directed.     desonide (DESOWEN) 0.05 % ointment Apply 1 Application topically 2 (two) times daily. PRN     Current Facility-Administered Medications  Medication Dose Route Frequency Provider Last Rate Last Admin   0.9 %  sodium chloride  infusion  500 mL Intravenous Once Elois Hair, MD        Allergies as of 08/30/2023   (No Known Allergies)    Family History  Problem Relation Age of Onset   Diabetes Father    Heart disease Father    Cancer Maternal Grandmother    Colon cancer Maternal Grandfather    Cancer Maternal Grandfather    Hypertension Paternal Grandmother    Miscarriages / Stillbirths Paternal Grandmother    Stomach cancer Neg Hx    Ulcerative colitis Neg Hx    Esophageal cancer Neg Hx     Social History   Socioeconomic History   Marital  status: Married    Spouse name: Not on file   Number of children: Not on file   Years of education: Not on file   Highest education level: Not on file  Occupational History   Not on file  Tobacco Use   Smoking status: Never    Passive exposure: Never   Smokeless tobacco: Never  Vaping Use   Vaping status: Never Used  Substance and Sexual Activity   Alcohol use: No   Drug use: No   Sexual activity: Yes    Birth control/protection: Pill, Surgical  Other Topics Concern   Not on file  Social History Narrative   Not on file   Social Drivers of Health   Financial Resource Strain: Not on file  Food Insecurity: Not on file  Transportation Needs: Not on file   Physical Activity: Not on file  Stress: Not on file  Social Connections: Not on file  Intimate Partner Violence: Not on file    Review of Systems:  All other review of systems negative except as mentioned in the HPI.  Physical Exam: Vital signs BP (!) 142/88   Pulse 80   Temp 97.7 F (36.5 C) (Temporal)   Ht 5\' 4"  (1.626 m)   Wt 170 lb (77.1 kg)   SpO2 100%   BMI 29.18 kg/m   General:   Alert,  Well-developed, well-nourished, pleasant and cooperative in NAD Airway:  Mallampati 1 Lungs:  Clear throughout to auscultation.   Heart:  Regular rate and rhythm; no murmurs, clicks, rubs,  or gallops. Abdomen:  Soft, nontender and nondistended. Normal bowel sounds.   Neuro/Psych:  Normal mood and affect. A and O x 3   Calyx Hawker E. Cherryl Corona, MD [facility]

## 2023-08-31 ENCOUNTER — Telehealth: Payer: Self-pay

## 2023-08-31 NOTE — Telephone Encounter (Signed)
 Left message on follow up call.

## 2023-09-01 LAB — SURGICAL PATHOLOGY

## 2023-09-03 ENCOUNTER — Ambulatory Visit: Payer: Self-pay | Admitting: Gastroenterology

## 2023-09-03 NOTE — Progress Notes (Signed)
 Leah Cook,  The polyp which I removed during your recent procedure was proven to be completely benign but is considered a "pre-cancerous" polyp that MAY have grown into cancer if it had not been removed.  Studies shows that at least 20% of women over age 46 and 30% of men over age 21 have pre-cancerous polyps.  Based on current nationally recognized surveillance guidelines, I recommend that you have a repeat colonoscopy in 7 years.   If you develop any new rectal bleeding, abdominal pain or significant bowel habit changes, please contact me before then.

## 2023-10-12 DIAGNOSIS — D229 Melanocytic nevi, unspecified: Secondary | ICD-10-CM | POA: Diagnosis not present

## 2023-10-12 DIAGNOSIS — L814 Other melanin hyperpigmentation: Secondary | ICD-10-CM | POA: Diagnosis not present

## 2023-10-12 DIAGNOSIS — L821 Other seborrheic keratosis: Secondary | ICD-10-CM | POA: Diagnosis not present

## 2023-10-12 DIAGNOSIS — L578 Other skin changes due to chronic exposure to nonionizing radiation: Secondary | ICD-10-CM | POA: Diagnosis not present
